# Patient Record
Sex: Female | Born: 1940 | Race: Black or African American | Hispanic: No | Smoking: Never smoker
Health system: Southern US, Community
[De-identification: ages and names within clinical notes are randomized; demographics above are authoritative.]

## PROBLEM LIST (undated history)

## (undated) DIAGNOSIS — F0390 Unspecified dementia without behavioral disturbance: Secondary | ICD-10-CM

## (undated) DIAGNOSIS — Z950 Presence of cardiac pacemaker: Secondary | ICD-10-CM

## (undated) DIAGNOSIS — I509 Heart failure, unspecified: Secondary | ICD-10-CM

## (undated) DIAGNOSIS — I10 Essential (primary) hypertension: Secondary | ICD-10-CM

## (undated) DIAGNOSIS — E119 Type 2 diabetes mellitus without complications: Secondary | ICD-10-CM

## (undated) DIAGNOSIS — N289 Disorder of kidney and ureter, unspecified: Secondary | ICD-10-CM

---

## 2016-03-12 ENCOUNTER — Emergency Department (HOSPITAL_COMMUNITY)
Admission: EM | Admit: 2016-03-12 | Discharge: 2016-03-12 | Disposition: A | Discharge status: Home / Self Care | Payer: Medicare Other | Attending: Emergency Medicine | Admitting: Emergency Medicine

## 2016-03-12 ENCOUNTER — Encounter (HOSPITAL_COMMUNITY): Payer: Self-pay | Admitting: Emergency Medicine

## 2016-03-12 ENCOUNTER — Emergency Department (HOSPITAL_COMMUNITY): Payer: Medicare Other

## 2016-03-12 DIAGNOSIS — M109 Gout, unspecified: Secondary | ICD-10-CM

## 2016-03-12 DIAGNOSIS — I509 Heart failure, unspecified: Secondary | ICD-10-CM | POA: Insufficient documentation

## 2016-03-12 DIAGNOSIS — Z79899 Other long term (current) drug therapy: Secondary | ICD-10-CM | POA: Insufficient documentation

## 2016-03-12 DIAGNOSIS — I110 Hypertensive heart disease with heart failure: Secondary | ICD-10-CM | POA: Insufficient documentation

## 2016-03-12 DIAGNOSIS — Z950 Presence of cardiac pacemaker: Secondary | ICD-10-CM | POA: Diagnosis not present

## 2016-03-12 DIAGNOSIS — Z7901 Long term (current) use of anticoagulants: Secondary | ICD-10-CM | POA: Diagnosis not present

## 2016-03-12 DIAGNOSIS — E119 Type 2 diabetes mellitus without complications: Secondary | ICD-10-CM | POA: Insufficient documentation

## 2016-03-12 DIAGNOSIS — M10071 Idiopathic gout, right ankle and foot: Secondary | ICD-10-CM | POA: Diagnosis not present

## 2016-03-12 DIAGNOSIS — M7989 Other specified soft tissue disorders: Secondary | ICD-10-CM | POA: Diagnosis present

## 2016-03-12 HISTORY — DX: Disorder of kidney and ureter, unspecified: N28.9

## 2016-03-12 HISTORY — DX: Essential (primary) hypertension: I10

## 2016-03-12 HISTORY — DX: Heart failure, unspecified: I50.9

## 2016-03-12 HISTORY — DX: Presence of cardiac pacemaker: Z95.0

## 2016-03-12 HISTORY — DX: Type 2 diabetes mellitus without complications: E11.9

## 2016-03-12 HISTORY — DX: Unspecified dementia, unspecified severity, without behavioral disturbance, psychotic disturbance, mood disturbance, and anxiety: F03.90

## 2016-03-12 LAB — BASIC METABOLIC PANEL
Anion gap: 8 (ref 5–15)
BUN: 21 mg/dL — ABNORMAL HIGH (ref 6–20)
CALCIUM: 8.9 mg/dL (ref 8.9–10.3)
CO2: 27 mmol/L (ref 22–32)
Chloride: 104 mmol/L (ref 101–111)
Creatinine, Ser: 1.6 mg/dL — ABNORMAL HIGH (ref 0.44–1.00)
GFR calc Af Amer: 35 mL/min — ABNORMAL LOW (ref >60)
GFR calc non Af Amer: 30 mL/min — ABNORMAL LOW (ref >60)
GLUCOSE: 221 mg/dL — AB (ref 65–99)
Potassium: 3.2 mmol/L — ABNORMAL LOW (ref 3.5–5.1)
Sodium: 139 mmol/L (ref 135–145)

## 2016-03-12 LAB — CBC
HCT: 34.3 % — ABNORMAL LOW (ref 36.0–46.0)
HEMOGLOBIN: 11.2 g/dL — AB (ref 12.0–15.0)
MCH: 28.1 pg (ref 26.0–34.0)
MCHC: 32.7 g/dL (ref 30.0–36.0)
MCV: 86 fL (ref 78.0–100.0)
PLATELETS: 224 10*3/uL (ref 150–400)
RBC: 3.99 MIL/uL (ref 3.87–5.11)
RDW: 15.3 % (ref 11.5–15.5)
WBC: 5.9 10*3/uL (ref 4.0–10.5)

## 2016-03-12 LAB — BRAIN NATRIURETIC PEPTIDE: B NATRIURETIC PEPTIDE 5: 1144.6 pg/mL — AB (ref 0.0–100.0)

## 2016-03-12 MED ORDER — PREDNISONE 20 MG PO TABS
40.0000 mg | ORAL_TABLET | Freq: Once | ORAL | Status: AC
  Administered 2016-03-12: 40 mg via ORAL
  Filled 2016-03-12: qty 2

## 2016-03-12 MED ORDER — PREDNISONE 10 MG PO TABS: 40.0000 mg | ORAL_TABLET | Freq: Every day | ORAL | 0 refills | Status: AC

## 2016-03-12 MED ORDER — POTASSIUM CHLORIDE CRYS ER 20 MEQ PO TBCR
20.0000 meq | EXTENDED_RELEASE_TABLET | Freq: Once | ORAL | Status: AC
Start: 2016-03-12 — End: 2016-03-12
  Administered 2016-03-12: 20 meq via ORAL
  Filled 2016-03-12: qty 1

## 2016-03-12 NOTE — ED Notes (Signed)
Patient transported to X-ray 

## 2016-03-12 NOTE — ED Provider Notes (Signed)
[facility]-EMERGENCY DEPT Provider Note   CSN: [id number] Arrival date & time: 03/12/16  [id number]     History   Chief Complaint Chief Complaint  Patient presents with  . Foot Swelling    HPI Sharon Whitehead is a 75 y.o. female.  The history is provided by the patient. No language interpreter was used.   Sharon Whitehead is a 75 y.o. female who presents to the Emergency Department complaining of swelling . She has a history of congestive heart failure reports 2 days of swelling to bilateral feet, right greater than left.  She has pain associated right foot, significantly with ambulation.  No fevers, chest pain, shortness of breath, abdominal pain.  She is eating and drinking at her baseline and urinating without difficulties. No injuries to the foot. She takes Eliquis for hx/o afib.  Past Medical History:  Diagnosis Date  . CHF (congestive heart failure) (HCC)   . Dementia   . Diabetes mellitus without complication (HCC)   . Hypertension   . Pacemaker   . Renal disorder     There are no active problems to display for this patient.   No past surgical history on file.  OB History    No data available       Home Medications    Prior to Admission medications   Medication Sig Start Date End Date Taking? Authorizing Provider  diltiazem (CARDIZEM CD) 120 MG 24 hr capsule Take 120 mg by mouth daily.   Yes Historical Provider, MD  ELIQUIS 5 MG TABS tablet Take 5 mg by mouth daily. 03/06/16  Yes Historical Provider, MD  furosemide (LASIX) 40 MG tablet Take 40 mg by mouth daily. 03/06/16  Yes Historical Provider, MD  lisinopril (PRINIVIL,ZESTRIL) 20 MG tablet Take 20 mg by mouth daily. 03/11/16  Yes Historical Provider, MD  Melatonin 3 MG CAPS Take 3 mg by mouth at bedtime as needed (sleep).   Yes Historical Provider, MD  metoprolol (LOPRESSOR) 100 MG tablet Take 200 mg by mouth 2 (two) times daily. 03/06/16  Yes Historical Provider, MD  predniSONE (DELTASONE) 10 MG tablet Take 4 tablets (40 mg  total) by mouth daily. 03/12/16   Tilden FossaElizabeth Clemons Salvucci, MD    Family History No family history on file.  Social History Social History  Substance Use Topics  . Smoking status: Never Smoker  . Smokeless tobacco: Not on file  . Alcohol use No     Allergies   Patient has no known allergies.   Review of Systems Review of Systems  All other systems reviewed and are negative.    Physical Exam Updated Vital Signs BP 136/98   Pulse 83   Temp 98.4 F (36.9 C) (Oral)   Resp 16   SpO2 100%   Physical Exam  Constitutional: She is oriented to person, place, and time. She appears well-developed and well-nourished.  HENT:  Head: Normocephalic and atraumatic.  Cardiovascular: Normal rate and regular rhythm.   No murmur heard. Pulmonary/Chest: Effort normal and breath sounds normal. No respiratory distress.  Abdominal: Soft. There is no tenderness. There is no rebound and no guarding.  Musculoskeletal:  2+ DP pulses to bilateral lower extremities.  1+ edema to the right ankle and foot, trace edema to the left ankle and foot. Moderate diffuse tenderness to the right mid foot with mild local erythema.   Neurological: She is alert and oriented to person, place, and time.  Skin: Skin is warm and dry.  Psychiatric: She has a normal mood and  affect. Her behavior is normal.  Nursing note and vitals reviewed.    ED Treatments / Results  Labs (all labs ordered are listed, but only abnormal results are displayed) Labs Reviewed  BASIC METABOLIC PANEL - Abnormal; Notable for the following:       Result Value   Potassium 3.2 (*)    Glucose, Bld 221 (*)    BUN 21 (*)    Creatinine, Ser 1.60 (*)    GFR calc non Af Amer 30 (*)    GFR calc Af Amer 35 (*)    All other components within normal limits  CBC - Abnormal; Notable for the following:    Hemoglobin 11.2 (*)    HCT 34.3 (*)    All other components within normal limits  BRAIN NATRIURETIC PEPTIDE - Abnormal; Notable for the following:     B Natriuretic Peptide 1,144.6 (*)    All other components within normal limits    EKG  EKG Interpretation  Date/Time:  Wednesday March 12 2016 07:58:44 EST Ventricular Rate:  86 PR Interval:    QRS Duration: 121 QT Interval:  396 QTC Calculation: 474 R Axis:   -67 Text Interpretation:  Atrial fibrillation Ventricular premature complex Left bundle branch block Confirmed by Lincoln Brighamees, Liz [phone]([id number]) on 03/12/2016 8:30:34 AM       Radiology Dg Chest 2 View  Result Date: 03/12/2016 CLINICAL DATA:  Right leg swelling, hypertension EXAM: CHEST  2 VIEW COMPARISON:  None. FINDINGS: Cardiomegaly is noted. Single lead cardiac pacemaker with tip of the lead in right ventricle. No infiltrate or pulmonary edema. Mild elevation of the right hemidiaphragm. Osteopenia and mild degenerative changes mid thoracic spine. Degenerative changes bilateral shoulders. IMPRESSION: Cardiomegaly. No active disease. Mild elevation of the right hemidiaphragm. Degenerative changes thoracic spine and bilateral shoulders. Single lead cardiac pacemaker in place. Electronically Signed   By: Natasha MeadLiviu  Pop M.D.   On: 03/12/2016 08:25    Procedures Procedures (including critical care time)  Medications Ordered in ED Medications  potassium chloride SA (K-DUR,KLOR-CON) CR tablet 20 mEq (20 mEq Oral Given 03/12/16 0909)  predniSONE (DELTASONE) tablet 40 mg (40 mg Oral Given 03/12/16 0948)     Initial Impression / Assessment and Plan / ED Course  I have reviewed the triage vital signs and the nursing notes.  Pertinent labs & imaging results that were available during my care of the patient were reviewed by me and considered in my medical decision making (see chart for details).  Clinical Course     Pt with hx/o CHF, here for evaluation of nontraumatic right foot pain.  She had significant swelling and tenderness to the foot with no deformity.  She is well perfused on examination.  Exam is concerning for acute gout,  there is no drainable fluid on exam.  No evidence of acute bacterial infection.  No evidence of acute CHF.  Treating her gout with steroids given her CKD.  Discussed home care, close follow-up and close return precautions.  Final Clinical Impressions(s) / ED Diagnoses   Final diagnoses:  Acute gout of right foot, unspecified cause    New Prescriptions Discharge Medication List as of 03/12/2016  9:36 AM    START taking these medications   Details  predniSONE (DELTASONE) 10 MG tablet Take 4 tablets (40 mg total) by mouth daily., Starting Wed 03/12/2016, Print         Tilden FossaElizabeth Cydnee Fuquay, MD 03/12/16 [id number]

## 2016-03-12 NOTE — ED Notes (Signed)
Discharge instructions, follow up care, and rx x1 reviewed with patient. Patient verbalized understanding. 

## 2016-03-12 NOTE — ED Notes (Signed)
ED Provider at bedside. 

## 2016-03-12 NOTE — ED Triage Notes (Addendum)
Per pt, states B/L feet swelling since yesterday-states she has pacemaker-no CP, no SOB

## 2017-08-29 DEATH — deceased

## 2018-02-16 IMAGING — CR DG CHEST 2V
2 series · 2 of 2 positions shown · non-contrast
Comparison: None.

CLINICAL DATA: Right leg swelling, hypertension

EXAM:
CHEST  2 VIEW

[x chest ap]
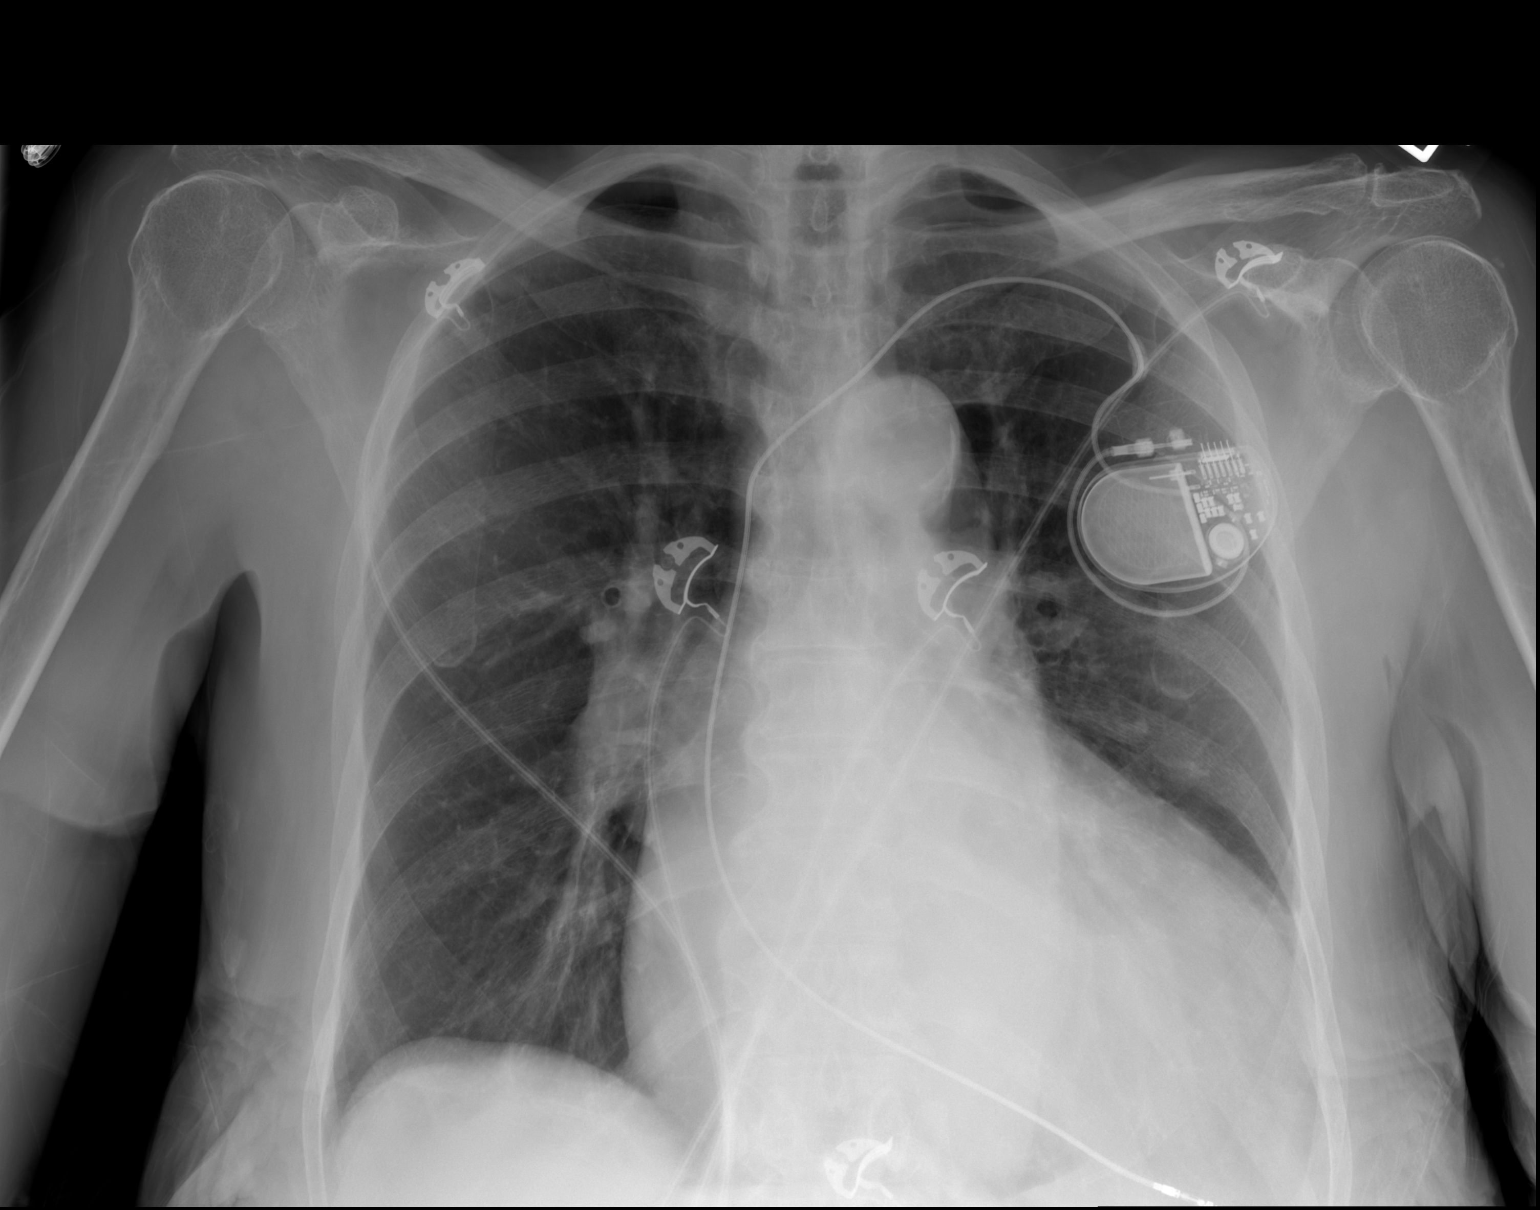

[w chest lat]
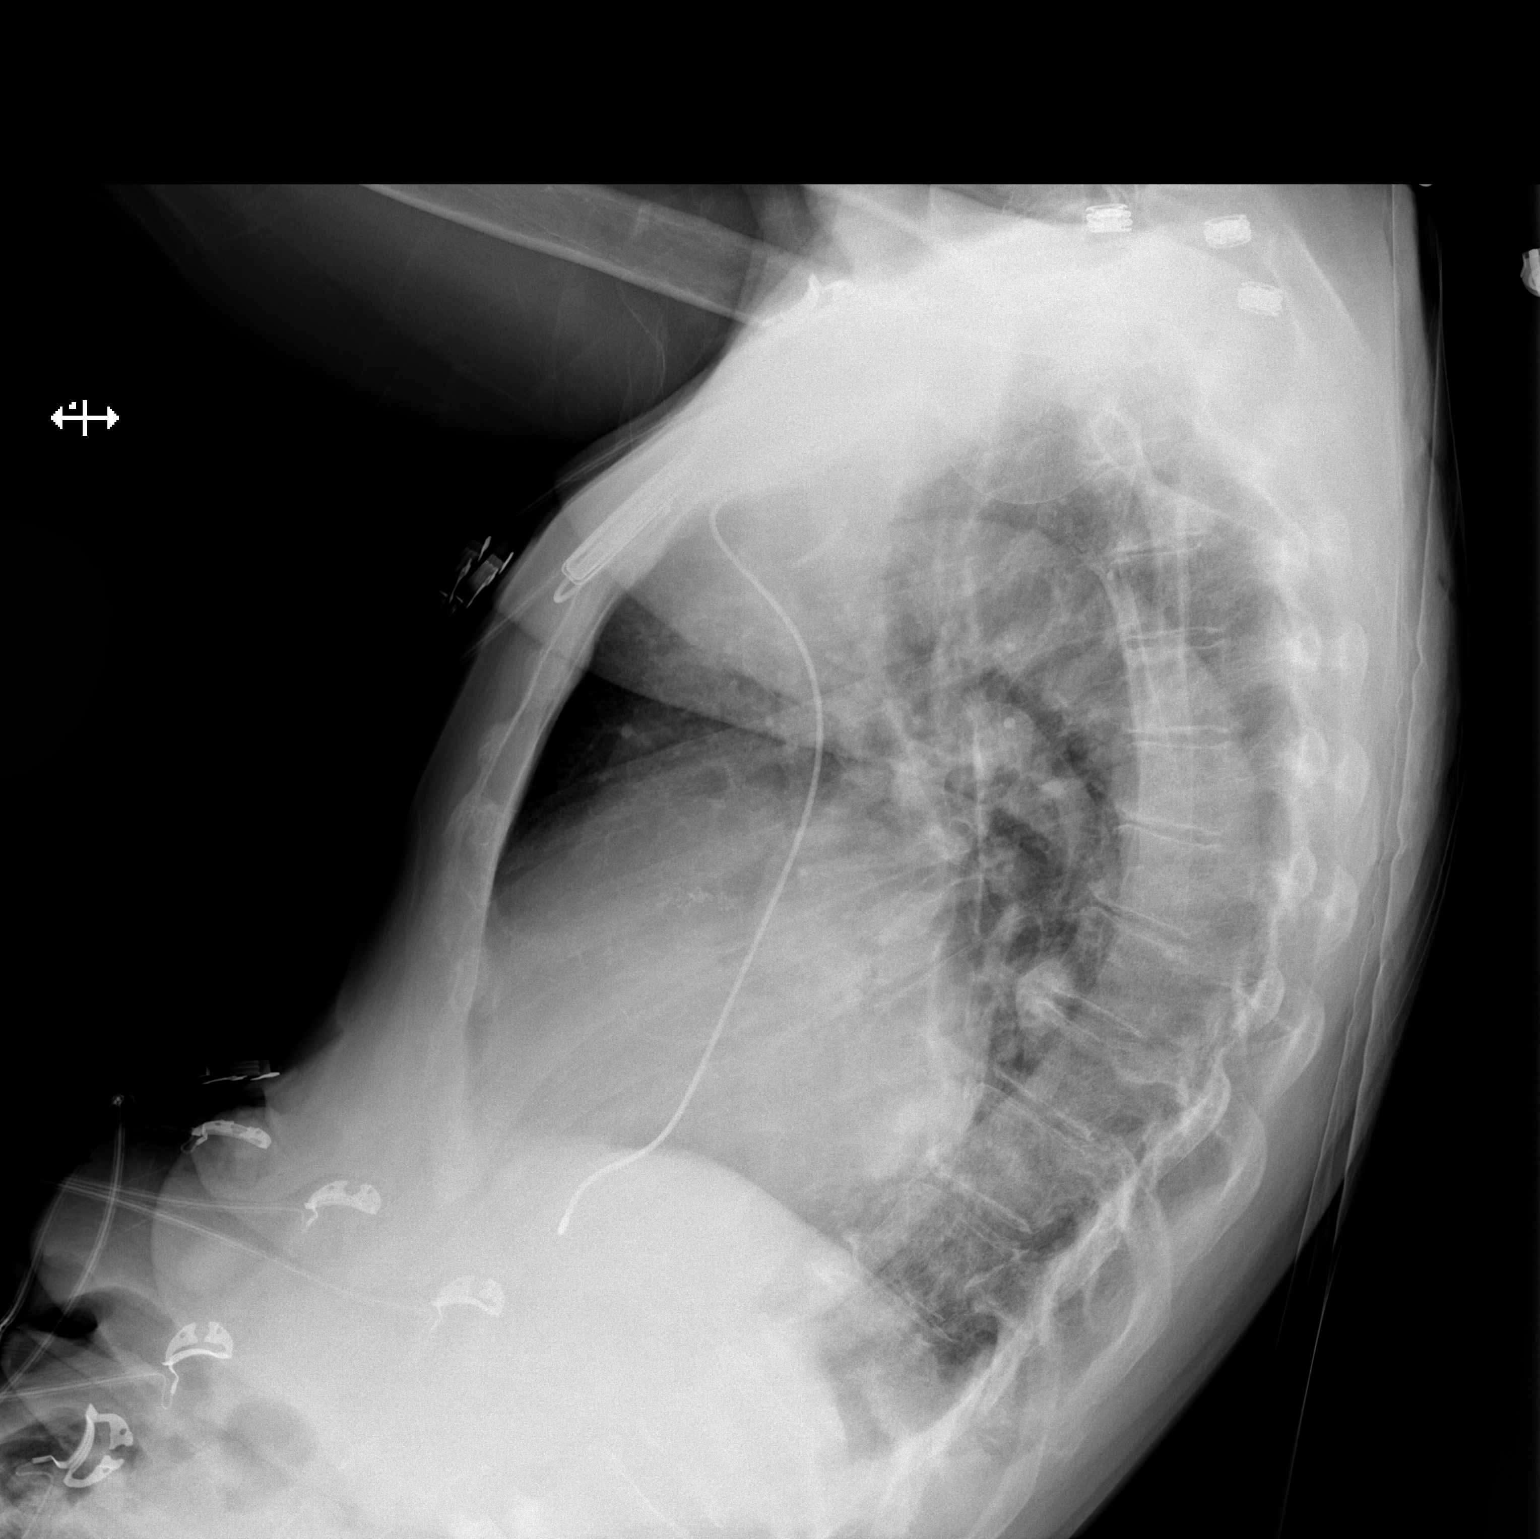

[2 of 2 positions shown; findings below may reference images not displayed]

FINDINGS: Cardiomegaly is noted. Single lead cardiac pacemaker with tip of the
lead in right ventricle. No infiltrate or pulmonary edema. Mild
elevation of the right hemidiaphragm. Osteopenia and mild
degenerative changes mid thoracic spine. Degenerative changes
bilateral shoulders.
IMPRESSION: Cardiomegaly. No active disease. Mild elevation of the right
hemidiaphragm. Degenerative changes thoracic spine and bilateral
shoulders. Single lead cardiac pacemaker in place.
